# Patient Record
Sex: Female | Born: 1965 | Race: Black or African American | Hispanic: No | Marital: Single | Smoking: Former smoker
Health system: Southern US, Community
[De-identification: ages and names within clinical notes are randomized; demographics above are authoritative.]

## PROBLEM LIST (undated history)

## (undated) DIAGNOSIS — M549 Dorsalgia, unspecified: Secondary | ICD-10-CM

## (undated) HISTORY — PX: COLONOSCOPY: SHX174

## (undated) HISTORY — PX: TUBAL LIGATION: SHX77

---

## 2010-07-03 ENCOUNTER — Ambulatory Visit
Admission: RE | Admit: 2010-07-03 | Discharge: 2010-07-03 | Payer: Self-pay | Source: Home / Self Care | Attending: Obstetrics & Gynecology | Admitting: Obstetrics & Gynecology

## 2010-07-03 ENCOUNTER — Encounter: Payer: Self-pay | Admitting: Obstetrics & Gynecology

## 2010-07-03 LAB — CONVERTED CEMR LAB
Cholesterol: 179 mg/dL (ref 0–200)
HDL: 51 mg/dL (ref >39)
Hepatitis B Surface Ag: NEGATIVE
MCV: 95.8 fL (ref 78.0–100.0)
Platelets: 316 10*3/uL (ref 150–400)
RBC: 4.08 M/uL (ref 3.87–5.11)
Total CHOL/HDL Ratio: 3.5
Triglycerides: 69 mg/dL (ref <150)
VLDL: 14 mg/dL (ref 0–40)
WBC: 6.1 10*3/uL (ref 4.0–10.5)

## 2010-07-08 ENCOUNTER — Ambulatory Visit (HOSPITAL_COMMUNITY)
Admission: RE | Admit: 2010-07-08 | Discharge: 2010-07-08 | Payer: Self-pay | Source: Home / Self Care | Attending: Obstetrics & Gynecology | Admitting: Obstetrics & Gynecology

## 2010-07-08 ENCOUNTER — Ambulatory Visit (HOSPITAL_COMMUNITY)
Admission: RE | Admit: 2010-07-08 | Discharge: 2010-07-08 | Payer: Self-pay | Source: Home / Self Care | Attending: Family Medicine | Admitting: Family Medicine

## 2010-07-08 ENCOUNTER — Ambulatory Visit (HOSPITAL_COMMUNITY)
Admission: RE | Admit: 2010-07-08 | Discharge: 2010-07-08 | Payer: Self-pay | Source: Home / Self Care | Admitting: Family Medicine

## 2011-06-02 ENCOUNTER — Other Ambulatory Visit: Payer: Self-pay | Admitting: Family Medicine

## 2011-06-27 ENCOUNTER — Emergency Department (INDEPENDENT_AMBULATORY_CARE_PROVIDER_SITE_OTHER): Payer: Self-pay

## 2011-06-27 ENCOUNTER — Emergency Department (HOSPITAL_BASED_OUTPATIENT_CLINIC_OR_DEPARTMENT_OTHER)
Admission: EM | Admit: 2011-06-27 | Discharge: 2011-06-27 | Disposition: A | Discharge status: Home / Self Care | Payer: Self-pay | Attending: Emergency Medicine | Admitting: Emergency Medicine

## 2011-06-27 ENCOUNTER — Encounter: Payer: Self-pay | Admitting: *Deleted

## 2011-06-27 ENCOUNTER — Other Ambulatory Visit: Payer: Self-pay

## 2011-06-27 DIAGNOSIS — J029 Acute pharyngitis, unspecified: Secondary | ICD-10-CM

## 2011-06-27 DIAGNOSIS — R51 Headache: Secondary | ICD-10-CM

## 2011-06-27 DIAGNOSIS — R05 Cough: Secondary | ICD-10-CM

## 2011-06-27 DIAGNOSIS — Z79899 Other long term (current) drug therapy: Secondary | ICD-10-CM | POA: Insufficient documentation

## 2011-06-27 DIAGNOSIS — J399 Disease of upper respiratory tract, unspecified: Secondary | ICD-10-CM

## 2011-06-27 DIAGNOSIS — J3489 Other specified disorders of nose and nasal sinuses: Secondary | ICD-10-CM

## 2011-06-27 DIAGNOSIS — J398 Other specified diseases of upper respiratory tract: Secondary | ICD-10-CM | POA: Insufficient documentation

## 2011-06-27 DIAGNOSIS — R059 Cough, unspecified: Secondary | ICD-10-CM | POA: Insufficient documentation

## 2011-06-27 LAB — BASIC METABOLIC PANEL
CO2: 26 mEq/L (ref 19–32)
Chloride: 103 mEq/L (ref 96–112)
Creatinine, Ser: 0.7 mg/dL (ref 0.50–1.10)
Glucose, Bld: 102 mg/dL — ABNORMAL HIGH (ref 70–99)

## 2011-06-27 LAB — CBC
HCT: 36.7 % (ref 36.0–46.0)
Hemoglobin: 12 g/dL (ref 12.0–15.0)
MCV: 94.3 fL (ref 78.0–100.0)
RBC: 3.89 MIL/uL (ref 3.87–5.11)
RDW: 12.9 % (ref 11.5–15.5)
WBC: 6.2 10*3/uL (ref 4.0–10.5)

## 2011-06-27 LAB — DIFFERENTIAL
Basophils Absolute: 0 10*3/uL (ref 0.0–0.1)
Eosinophils Relative: 1 % (ref 0–5)
Lymphocytes Relative: 7 % — ABNORMAL LOW (ref 12–46)
Lymphs Abs: 0.5 10*3/uL — ABNORMAL LOW (ref 0.7–4.0)
Monocytes Absolute: 0.9 10*3/uL (ref 0.1–1.0)
Neutro Abs: 4.8 10*3/uL (ref 1.7–7.7)

## 2011-06-27 LAB — CARDIAC PANEL(CRET KIN+CKTOT+MB+TROPI)
CK, MB: 1.5 ng/mL (ref 0.3–4.0)
Relative Index: INVALID (ref 0.0–2.5)
Total CK: 80 U/L (ref 7–177)

## 2011-06-27 MED ORDER — FLUTICASONE PROPIONATE 50 MCG/ACT NA SUSP: 2.0000 | Freq: Every day | NASAL | Status: AC

## 2011-06-27 MED ORDER — IBUPROFEN 600 MG PO TABS: 600.0000 mg | ORAL_TABLET | Freq: Four times a day (QID) | ORAL | Status: AC | PRN

## 2011-06-27 MED ORDER — DESLORATADINE 5 MG PO TABS: 5.0000 mg | ORAL_TABLET | Freq: Every day | ORAL | Status: AC

## 2011-06-27 NOTE — ED Notes (Signed)
Pt reports nasal congestion productive cough sore throat HA and fever states that she had the same sx 3 weeks ago and that it has came back

## 2011-06-27 NOTE — ED Provider Notes (Addendum)
History     CSN: [id number]  Arrival date & time 06/27/11  7829   First MD Initiated Contact with Patient 06/27/11 [phone]      Chief Complaint  Patient presents with  . Cough    (Consider location/radiation/quality/duration/timing/severity/associated sxs/prior treatment) Patient is a 45 y.o. female presenting with cough. The history is provided by the patient. No language interpreter was used.  Cough This is a recurrent problem. The current episode started more than 2 days ago. The problem occurs every few hours. The problem has not changed since onset.The cough is productive of sputum. The maximum temperature recorded prior to her arrival was 100 to 100.9 F. The fever has been present for 3 to 4 days. Associated symptoms include ear pain, headaches, rhinorrhea and sore throat. Pertinent negatives include no chills, no sweats, no weight loss, no ear congestion, no myalgias, no shortness of breath and no wheezing. Associated symptoms comments: Chest congestion. The treatment provided no relief.  No DOE no SOB no n/v/d.  No swelling of the lower extremities no long car trips or plane trips.  No OCPs PERC negative  History reviewed. No pertinent past medical history.  Past Surgical History  Procedure Date  . Cesarean section   . Tubal ligation     History reviewed. No pertinent family history.  History  Substance Use Topics  . Smoking status: Never Smoker   . Smokeless tobacco: Not on file  . Alcohol Use: No    OB History    Grav Para Term Preterm Abortions TAB SAB Ect Mult Living   4 2   1  1          Review of Systems  Constitutional: Negative for chills and weight loss.  HENT: Positive for ear pain, sore throat and rhinorrhea.   Eyes: Negative for discharge.  Respiratory: Positive for cough. Negative for shortness of breath and wheezing.   Cardiovascular: Negative for palpitations and leg swelling.  Gastrointestinal: Negative.  Negative for abdominal pain.    Genitourinary: Negative for difficulty urinating.  Musculoskeletal: Negative for myalgias and arthralgias.  Neurological: Positive for headaches.  Psychiatric/Behavioral: Negative.     Allergies  Citric acid-sodium citrate  Home Medications   Current Outpatient Rx  Name Route Sig Dispense Refill  . OMEGA-3 FATTY ACIDS 1000 MG PO CAPS Oral Take 2 g by mouth daily.      . DESLORATADINE 5 MG PO TABS Oral Take 1 tablet (5 mg total) by mouth daily. 7 tablet 0  . FLUTICASONE PROPIONATE 50 MCG/ACT NA SUSP Nasal Place 2 sprays into the nose daily. 50 g 0  . IBUPROFEN 600 MG PO TABS Oral Take 1 tablet (600 mg total) by mouth every 6 (six) hours as needed for pain. 30 tablet 0    BP 121/77  Pulse 104  Temp(Src) 99.6 F (37.6 C) (Oral)  Resp 18  SpO2 97%  LMP 06/21/2011  Physical Exam  Constitutional: She appears well-developed and well-nourished.  HENT:  Head: Normocephalic and atraumatic.  Mouth/Throat: Oropharynx is clear and moist.  Eyes: EOM are normal. Pupils are equal, round, and reactive to light.  Neck: Normal range of motion. Neck supple.  Cardiovascular: Normal rate and regular rhythm.   Pulmonary/Chest: Effort normal and breath sounds normal. No stridor. She has no wheezes.  Abdominal: Bowel sounds are normal. There is no tenderness.  Musculoskeletal: Normal range of motion. She exhibits no edema and no tenderness.  Lymphadenopathy:    She has no cervical adenopathy.  Neurological:  She is alert.  Skin: Skin is warm and dry.  Psychiatric: Thought content normal.    ED Course  Procedures (including critical care time)  Labs Reviewed  DIFFERENTIAL - Abnormal; Notable for the following:    Lymphocytes Relative 7 (*)    Lymphs Abs 0.5 (*)    Monocytes Relative 15 (*)    All other components within normal limits  BASIC METABOLIC PANEL - Abnormal; Notable for the following:    Glucose, Bld 102 (*)    All other components within normal limits  CBC  CARDIAC  PANEL(CRET KIN+CKTOT+MB+TROPI)   Dg Chest 2 View  06/27/2011  *RADIOLOGY REPORT*  Clinical Data: Nasal congestion, productive cough, sore throat, headache and fever.  CHEST - 2 VIEW  Comparison: None.  Findings: The heart size and pulmonary vascularity are normal. The lungs appear clear and expanded without focal air space disease or consolidation. No blunting of the costophrenic angles.  No pneumothorax.  Degenerative changes in the spine.  IMPRESSION: No evidence of active pulmonary disease.  Original Report Authenticated By: Marlon Pel, M.D.     1. Upper respiratory disease       MDM   Date: 06/27/2011  Rate: 100  Rhythm: normal sinus rhythm  QRS Axis: normal  Intervals: normal  ST/T Wave abnormalities: normal  Conduction Disutrbances:none  Narrative Interpretation:   Old EKG Reviewed: none available      Return for persistent symptoms shortness or breath chest pain with radiation or any concerning symptoms   Tysheena Ginzburg K Vona Whiters-Rasch, MD 06/27/11 0659  Jamiel Goncalves K Adelaido Nicklaus-Rasch, MD 06/27/11 [phone]

## 2011-07-17 ENCOUNTER — Emergency Department (INDEPENDENT_AMBULATORY_CARE_PROVIDER_SITE_OTHER): Payer: No Typology Code available for payment source

## 2011-07-17 ENCOUNTER — Encounter (HOSPITAL_BASED_OUTPATIENT_CLINIC_OR_DEPARTMENT_OTHER): Payer: Self-pay

## 2011-07-17 ENCOUNTER — Emergency Department (HOSPITAL_BASED_OUTPATIENT_CLINIC_OR_DEPARTMENT_OTHER)
Admission: EM | Admit: 2011-07-17 | Discharge: 2011-07-17 | Disposition: A | Discharge status: Home / Self Care | Payer: No Typology Code available for payment source | Attending: Emergency Medicine | Admitting: Emergency Medicine

## 2011-07-17 DIAGNOSIS — M545 Low back pain: Secondary | ICD-10-CM

## 2011-07-17 DIAGNOSIS — S139XXA Sprain of joints and ligaments of unspecified parts of neck, initial encounter: Secondary | ICD-10-CM | POA: Insufficient documentation

## 2011-07-17 DIAGNOSIS — S161XXA Strain of muscle, fascia and tendon at neck level, initial encounter: Secondary | ICD-10-CM

## 2011-07-17 DIAGNOSIS — M542 Cervicalgia: Secondary | ICD-10-CM

## 2011-07-17 DIAGNOSIS — IMO0002 Reserved for concepts with insufficient information to code with codable children: Secondary | ICD-10-CM | POA: Insufficient documentation

## 2011-07-17 DIAGNOSIS — S39012A Strain of muscle, fascia and tendon of lower back, initial encounter: Secondary | ICD-10-CM

## 2011-07-17 DIAGNOSIS — Y92410 Unspecified street and highway as the place of occurrence of the external cause: Secondary | ICD-10-CM | POA: Insufficient documentation

## 2011-07-17 MED ORDER — OXYCODONE-ACETAMINOPHEN 5-325 MG PO TABS
2.0000 | ORAL_TABLET | Freq: Once | ORAL | Status: AC
  Administered 2011-07-17: 2 via ORAL
  Filled 2011-07-17: qty 2

## 2011-07-17 MED ORDER — HYDROCODONE-ACETAMINOPHEN 5-500 MG PO TABS: 1.0000 | ORAL_TABLET | Freq: Four times a day (QID) | ORAL | Status: AC | PRN

## 2011-07-17 MED ORDER — METAXALONE 800 MG PO TABS: 800.0000 mg | ORAL_TABLET | Freq: Three times a day (TID) | ORAL | Status: AC
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
## 2011-07-17 NOTE — ED Notes (Signed)
The patient had to be brought back in a wheel chair. She had to be helped out of the chair and onto the bed.
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
## 2012-02-15 ENCOUNTER — Emergency Department (HOSPITAL_BASED_OUTPATIENT_CLINIC_OR_DEPARTMENT_OTHER)
Admission: EM | Admit: 2012-02-15 | Discharge: 2012-02-15 | Disposition: A | Discharge status: Home / Self Care | Payer: No Typology Code available for payment source | Attending: Emergency Medicine | Admitting: Emergency Medicine

## 2012-02-15 ENCOUNTER — Encounter (HOSPITAL_BASED_OUTPATIENT_CLINIC_OR_DEPARTMENT_OTHER): Payer: Self-pay | Admitting: Emergency Medicine

## 2012-02-15 DIAGNOSIS — M549 Dorsalgia, unspecified: Secondary | ICD-10-CM | POA: Insufficient documentation

## 2012-02-15 DIAGNOSIS — M5430 Sciatica, unspecified side: Secondary | ICD-10-CM

## 2012-02-15 HISTORY — DX: Dorsalgia, unspecified: M54.9

## 2012-02-15 MED ORDER — HYDROCODONE-ACETAMINOPHEN 5-325 MG PO TABS: 1.0000 | ORAL_TABLET | Freq: Four times a day (QID) | ORAL | Status: AC | PRN

## 2012-02-15 MED ORDER — HYDROCODONE-ACETAMINOPHEN 5-325 MG PO TABS
1.0000 | ORAL_TABLET | Freq: Once | ORAL | Status: AC
  Administered 2012-02-15: 1 via ORAL
  Filled 2012-02-15: qty 1
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
## 2012-02-15 NOTE — ED Provider Notes (Signed)
History     CSN: [id number]  Arrival date & time 02/15/12  0358   First MD Initiated Contact with Patient 02/15/12 [phone]      Chief Complaint  Patient presents with  . Back Pain    (Consider location/radiation/quality/duration/timing/severity/associated sxs/prior treatment) HPI Is a 46 year old black female with a history of chronic back pain. She's had exacerbation of her back pain about 3 weeks ago. The pain is located in the bilateral paralumbar regions and radiates to the buttocks and the backs of the thighs. There is no weakness or numbness associated with it although she does have chronic tingling of the hands and feet for which she states are unrelated reasons. She is on naproxen but this is not providing adequate relief. She denies bowel or bladder changes. She denies acute injury. The pain is moderate to severe at times, worse with movement.  Past Medical History  Diagnosis Date  . Back pain     Past Surgical History  Procedure Date  . Cesarean section   . Tubal ligation     No family history on file.  History  Substance Use Topics  . Smoking status: Never Smoker   . Smokeless tobacco: Not on file  . Alcohol Use: Yes    OB History    Grav Para Term Preterm Abortions TAB SAB Ect Mult Living   4 2   1  1          Review of Systems  All other systems reviewed and are negative.    Allergies  Sod citrate-citric acid  Home Medications   Current Outpatient Rx  Name Route Sig Dispense Refill  . CYCLOBENZAPRINE HCL 10 MG PO TABS Oral Take 10 mg by mouth as needed.    Marland Kitchen NAPROXEN 500 MG PO TABS Oral Take 500 mg by mouth 2 (two) times daily with a meal.    . DESLORATADINE 5 MG PO TABS Oral Take 1 tablet (5 mg total) by mouth daily. 7 tablet 0  . OMEGA-3 FATTY ACIDS 1000 MG PO CAPS Oral Take 2 g by mouth daily.      Marland Kitchen FLUTICASONE PROPIONATE 50 MCG/ACT NA SUSP Nasal Place 2 sprays into the nose daily. 50 g 0    BP 123/88  Pulse 75  Temp 97.6 F (36.4 C)  (Oral)  Resp 18  SpO2 99%  Physical Exam General: Well-developed, well-nourished female in no acute distress; appearance consistent with age of record HENT: normocephalic, atraumatic Eyes: pupils equal round and reactive to light; extraocular muscles intact Neck: supple Heart: regular rate and rhythm Lungs: clear to auscultation bilaterally Abdomen: soft; nondistended Back: Paralumbar tenderness, left greater than right Extremities: No deformity; full range of motion; pulses normal Neurologic: Awake, alert and oriented; motor function intact in all extremities and symmetric; no facial droop Skin: Warm and dry Psychiatric: Normal mood and affect    ED Course  Procedures (including critical care time)    MDM          Hanley Seamen, MD 02/15/12 [phone]

## 2012-02-15 NOTE — ED Notes (Signed)
Pt c/o lower back pain and bilateral hip pain x 3 weeks.

## 2012-02-18 ENCOUNTER — Other Ambulatory Visit: Payer: Self-pay | Admitting: Obstetrics & Gynecology

## 2012-04-03 ENCOUNTER — Other Ambulatory Visit (HOSPITAL_COMMUNITY): Payer: Self-pay | Admitting: Unknown Physician Specialty

## 2012-04-04 ENCOUNTER — Other Ambulatory Visit (HOSPITAL_COMMUNITY): Payer: Self-pay | Admitting: Unknown Physician Specialty

## 2012-04-04 DIAGNOSIS — Z1231 Encounter for screening mammogram for malignant neoplasm of breast: Secondary | ICD-10-CM

## 2012-04-25 ENCOUNTER — Ambulatory Visit (HOSPITAL_COMMUNITY)
Admission: RE | Admit: 2012-04-25 | Discharge: 2012-04-25 | Disposition: A | Discharge status: Home / Self Care | Payer: Self-pay | Source: Ambulatory Visit | Attending: Unknown Physician Specialty | Admitting: Unknown Physician Specialty

## 2012-04-25 DIAGNOSIS — Z1231 Encounter for screening mammogram for malignant neoplasm of breast: Secondary | ICD-10-CM
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
## 2012-10-24 ENCOUNTER — Emergency Department (HOSPITAL_BASED_OUTPATIENT_CLINIC_OR_DEPARTMENT_OTHER)
Admission: EM | Admit: 2012-10-24 | Discharge: 2012-10-24 | Disposition: A | Discharge status: Home / Self Care | Payer: Self-pay | Attending: Emergency Medicine | Admitting: Emergency Medicine

## 2012-10-24 ENCOUNTER — Encounter (HOSPITAL_BASED_OUTPATIENT_CLINIC_OR_DEPARTMENT_OTHER): Payer: Self-pay | Admitting: *Deleted

## 2012-10-24 DIAGNOSIS — Y92410 Unspecified street and highway as the place of occurrence of the external cause: Secondary | ICD-10-CM | POA: Insufficient documentation

## 2012-10-24 DIAGNOSIS — S161XXA Strain of muscle, fascia and tendon at neck level, initial encounter: Secondary | ICD-10-CM

## 2012-10-24 DIAGNOSIS — M545 Low back pain, unspecified: Secondary | ICD-10-CM | POA: Insufficient documentation

## 2012-10-24 DIAGNOSIS — S139XXA Sprain of joints and ligaments of unspecified parts of neck, initial encounter: Secondary | ICD-10-CM | POA: Insufficient documentation

## 2012-10-24 DIAGNOSIS — Z79899 Other long term (current) drug therapy: Secondary | ICD-10-CM | POA: Insufficient documentation

## 2012-10-24 DIAGNOSIS — IMO0002 Reserved for concepts with insufficient information to code with codable children: Secondary | ICD-10-CM | POA: Insufficient documentation

## 2012-10-24 DIAGNOSIS — R209 Unspecified disturbances of skin sensation: Secondary | ICD-10-CM | POA: Insufficient documentation

## 2012-10-24 DIAGNOSIS — G8929 Other chronic pain: Secondary | ICD-10-CM | POA: Insufficient documentation

## 2012-10-24 DIAGNOSIS — Y939 Activity, unspecified: Secondary | ICD-10-CM | POA: Insufficient documentation

## 2012-10-24 MED ORDER — HYDROCODONE-ACETAMINOPHEN 5-325 MG PO TABS: 2.0000 | ORAL_TABLET | ORAL | Status: AC | PRN

## 2012-10-24 MED ORDER — CYCLOBENZAPRINE HCL 10 MG PO TABS: 10.0000 mg | ORAL_TABLET | Freq: Two times a day (BID) | ORAL | Status: DC | PRN
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
## 2012-10-24 NOTE — ED Notes (Signed)
Shoulder and neck and upper back pain reports throbbing shooting pains in her hands states that has been going on 2 weeks

## 2013-06-12 ENCOUNTER — Other Ambulatory Visit (HOSPITAL_COMMUNITY): Payer: Self-pay | Admitting: Unknown Physician Specialty

## 2013-06-12 DIAGNOSIS — Z1231 Encounter for screening mammogram for malignant neoplasm of breast: Secondary | ICD-10-CM

## 2013-07-03 ENCOUNTER — Ambulatory Visit (HOSPITAL_COMMUNITY)
Admission: RE | Admit: 2013-07-03 | Discharge: 2013-07-03 | Disposition: A | Discharge status: Home / Self Care | Payer: BC Managed Care – PPO | Source: Ambulatory Visit | Attending: Unknown Physician Specialty | Admitting: Unknown Physician Specialty

## 2013-07-03 DIAGNOSIS — Z1231 Encounter for screening mammogram for malignant neoplasm of breast: Secondary | ICD-10-CM | POA: Insufficient documentation

## 2014-04-29 ENCOUNTER — Encounter (HOSPITAL_BASED_OUTPATIENT_CLINIC_OR_DEPARTMENT_OTHER): Payer: Self-pay | Admitting: *Deleted

## 2019-03-04 ENCOUNTER — Emergency Department (HOSPITAL_BASED_OUTPATIENT_CLINIC_OR_DEPARTMENT_OTHER)
Admission: EM | Admit: 2019-03-04 | Discharge: 2019-03-04 | Disposition: A | Discharge status: Home / Self Care | Payer: BC Managed Care – PPO | Attending: Emergency Medicine | Admitting: Emergency Medicine

## 2019-03-04 ENCOUNTER — Encounter (HOSPITAL_BASED_OUTPATIENT_CLINIC_OR_DEPARTMENT_OTHER): Payer: Self-pay | Admitting: Emergency Medicine

## 2019-03-04 ENCOUNTER — Other Ambulatory Visit: Payer: Self-pay

## 2019-03-04 DIAGNOSIS — M5431 Sciatica, right side: Secondary | ICD-10-CM | POA: Diagnosis not present

## 2019-03-04 DIAGNOSIS — M5432 Sciatica, left side: Secondary | ICD-10-CM | POA: Diagnosis not present

## 2019-03-04 DIAGNOSIS — M545 Low back pain: Secondary | ICD-10-CM | POA: Diagnosis present

## 2019-03-04 MED ORDER — CYCLOBENZAPRINE HCL 10 MG PO TABS: 10.0000 mg | ORAL_TABLET | Freq: Two times a day (BID) | ORAL | 0 refills | Status: DC | PRN

## 2019-03-04 MED ORDER — NAPROXEN 500 MG PO TABS: 500.0000 mg | ORAL_TABLET | Freq: Two times a day (BID) | ORAL | 0 refills | Status: AC | PRN

## 2019-03-04 MED ORDER — KETOROLAC TROMETHAMINE 60 MG/2ML IM SOLN
30.0000 mg | Freq: Once | INTRAMUSCULAR | Status: AC
  Administered 2019-03-04: 10:00:00 30 mg via INTRAMUSCULAR
  Filled 2019-03-04: qty 2

## 2019-03-04 MED ORDER — PREDNISONE 10 MG (21) PO TBPK: ORAL_TABLET | Freq: Every day | ORAL | 0 refills | Status: DC

## 2019-03-04 NOTE — Discharge Instructions (Signed)
Recommend taking the naproxen as prescribed, steroids as prescribed as well as Tylenol for pain control.  May additionally use Flexeril as a muscle relaxant.  Please note that this medication can make you somewhat drowsy and should not be taken while driving or operating heavy machinery.  Recommend following up with your neurosurgeon as well as your primary care doctor.  Would recommend pursuing physical therapy as well.  If you develop any numbness, weakness, bladder or bowel incontinence, urinary retention, please return to ER for reassessment.

## 2019-03-04 NOTE — ED Provider Notes (Signed)
[facility]ERG[facility] DEPARTMENT Provider Note   CSN: [id number] Arrival date & time: 03/04/19  0906     History   Chief Complaint Chief Complaint  Patient presents with  . Back Pain  . Leg Pain    HPI Lindsey Harris is a 53 y.o. female.  Presents emergency room chief complaint low back pain, radiating bilaterally.  Patient states she for many years she has had episodes of low back pain, has been evaluated by neurosurgery, has received prior dural steroid injections, has gone through physical therapy.  States most recent episode started approximately 1 week ago, similar to prior, lower back, radiates down both right and left leg.  No associated numbness, tingling, bladder or bowel incontinence, saddle anesthesia, urinary retention.  Denies prior history IV drug use.  Has been periodically taking Tylenol, naproxen with intermittent relief.  No difficulty ambulating.     HPI  Past Medical History:  Diagnosis Date  . Back pain     There are no active problems to display for this patient.   Past Surgical History:  Procedure Laterality Date  . CESAREAN SECTION    . TUBAL LIGATION       OB History    Gravida  4   Para  2   Term      Preterm      AB  1   Living        SAB  1   TAB      Ectopic      Multiple      Live Births               Home Medications    Prior to Admission medications   Medication Sig Start Date End Date Taking? Authorizing Provider  cyclobenzaprine (FLEXERIL) 10 MG tablet Take 10 mg by mouth as needed.    [provider]  cyclobenzaprine (FLEXERIL) 10 MG tablet Take 1 tablet (10 mg total) by mouth 2 (two) times daily as needed for muscle spasms. 03/04/19   Lucrezia Starch, MD  desloratadine (CLARINEX) 5 MG tablet Take 1 tablet (5 mg total) by mouth daily. 06/27/11 06/26/12  Palumbo, April, MD  fish oil-omega-3 fatty acids 1000 MG capsule Take 2 g by mouth daily.      [provider]  fluticasone  (FLONASE) 50 MCG/ACT nasal spray Place 2 sprays into the nose daily. 06/27/11 06/26/12  Palumbo, April, MD  HYDROcodone-acetaminophen (NORCO/VICODIN) 5-325 MG per tablet Take 2 tablets by mouth every 4 (four) hours as needed for pain. 10/24/12   Malvin Johns, MD  naproxen (NAPROSYN) 500 MG tablet Take 500 mg by mouth 2 (two) times daily with a meal.    [provider]  naproxen (NAPROSYN) 500 MG tablet Take 1 tablet (500 mg total) by mouth 2 (two) times daily as needed. 03/04/19   Lucrezia Starch, MD  predniSONE (STERAPRED UNI-PAK 21 TAB) 10 MG (21) TBPK tablet Take by mouth daily. Take 6 tabs by mouth daily  for 1 day, then 5 tabs for 1 days, then 4 tabs for 1 days, then 3 tabs for 1 days, 2 tabs for 1 days, then 1 tab by mouth daily for 1 days 03/04/19   Lucrezia Starch, MD    Family History No family history on file.  Social History Social History   Tobacco Use  . Smoking status: Former Research scientist (life sciences)  . Smokeless tobacco: Never Used  Substance Use Topics  . Alcohol use: Yes  Comment: occ  . Drug use: No     Allergies   Sod citrate-citric acid   Review of Systems Review of Systems  Constitutional: Negative for chills and fever.  HENT: Negative for ear pain and sore throat.   Eyes: Negative for pain and visual disturbance.  Respiratory: Negative for cough and shortness of breath.   Cardiovascular: Negative for chest pain and palpitations.  Gastrointestinal: Negative for abdominal pain and vomiting.  Genitourinary: Negative for dysuria and hematuria.  Musculoskeletal: Positive for back pain. Negative for arthralgias.  Skin: Negative for color change and rash.  Neurological: Negative for seizures and syncope.  All other systems reviewed and are negative.    Physical Exam Updated Vital Signs BP 118/80 (BP Location: Right Arm)   Pulse 74   Temp 98.4 F (36.9 C) (Oral)   Resp 18   Ht 5' 4.5" (1.638 m)   Wt 82.4 kg   LMP 07/03/2013   SpO2 100%   BMI 30.71 kg/m    Physical Exam Vitals signs and nursing note reviewed.  Constitutional:      General: She is not in acute distress.    Appearance: She is well-developed.  HENT:     Head: Normocephalic and atraumatic.  Eyes:     Conjunctiva/sclera: Conjunctivae normal.  Neck:     Musculoskeletal: Normal range of motion and neck supple.  Cardiovascular:     Rate and Rhythm: Normal rate and regular rhythm.     Heart sounds: No murmur.  Pulmonary:     Effort: Pulmonary effort is normal. No respiratory distress.     Breath sounds: Normal breath sounds.  Abdominal:     Palpations: Abdomen is soft.     Tenderness: There is no abdominal tenderness.  Musculoskeletal:     Comments: Tenderness palpation over lateral left and lateral right lumbar region  Skin:    General: Skin is warm and dry.     Capillary Refill: Capillary refill takes less than 2 seconds.  Neurological:     General: No focal deficit present.     Mental Status: She is alert and oriented to person, place, and time.     Comments: 5 out of 5 strength in bilateral lower extremities, sensation to light touch intact throughout lower extremities      ED Treatments / Results  Labs (all labs ordered are listed, but only abnormal results are displayed) Labs Reviewed - No data to display  EKG None  Radiology No results found.  Procedures Procedures (including critical care time)  Medications Ordered in ED Medications  ketorolac (TORADOL) injection 30 mg (30 mg Intramuscular Given 03/04/19 0952)     Initial Impression / Assessment and Plan / ED Course  I have reviewed the triage vital signs and the nursing notes.  Pertinent labs & imaging results that were available during my care of the patient were reviewed by me and considered in my medical decision making (see chart for details).        53 year old lady presented to the emergency department with acute on chronic low back pain radiating bilaterally.  On exam she is  well-appearing without any significant distress from pain, ambulating without difficulty.  No red flag symptoms, normal neurologic exam.  Had been evaluated previously by spine surgery, had previously gone through physical therapy.  I recommended that she follow-up with her primary doctor, spine surgeon, as well as physical therapy.  Will prescribe steroid taper, NSAIDs, muscle relaxant.  Reviewed return precautions, will discharge home.  After the discussed management above, the patient was determined to be safe for discharge.  The patient was in agreement with this plan and all questions regarding their care were answered.  ED return precautions were discussed and the patient will return to the ED with any significant worsening of condition.    Final Clinical Impressions(s) / ED Diagnoses   Final diagnoses:  Bilateral sciatica    ED Discharge Orders         Ordered    cyclobenzaprine (FLEXERIL) 10 MG tablet  2 times daily PRN     03/04/19 1010    predniSONE (STERAPRED UNI-PAK 21 TAB) 10 MG (21) TBPK tablet  Daily     03/04/19 1010    naproxen (NAPROSYN) 500 MG tablet  2 times daily PRN     03/04/19 1010           Milagros Loll, MD 03/04/19 1147

## 2019-03-04 NOTE — ED Triage Notes (Signed)
Pt c/o lower back pain with radiation to BLE, chronic for years, but worse over the last few weeks

## 2020-03-30 ENCOUNTER — Encounter (HOSPITAL_BASED_OUTPATIENT_CLINIC_OR_DEPARTMENT_OTHER): Payer: Self-pay | Admitting: Emergency Medicine

## 2020-03-30 ENCOUNTER — Other Ambulatory Visit: Payer: Self-pay

## 2020-03-30 ENCOUNTER — Emergency Department (HOSPITAL_BASED_OUTPATIENT_CLINIC_OR_DEPARTMENT_OTHER)
Admission: EM | Admit: 2020-03-30 | Discharge: 2020-03-30 | Disposition: A | Discharge status: Home / Self Care | Payer: BC Managed Care – PPO | Attending: Emergency Medicine | Admitting: Emergency Medicine

## 2020-03-30 DIAGNOSIS — Z87891 Personal history of nicotine dependence: Secondary | ICD-10-CM | POA: Insufficient documentation

## 2020-03-30 DIAGNOSIS — M545 Low back pain, unspecified: Secondary | ICD-10-CM | POA: Insufficient documentation

## 2020-03-30 DIAGNOSIS — M5136 Other intervertebral disc degeneration, lumbar region: Secondary | ICD-10-CM

## 2020-03-30 DIAGNOSIS — M5431 Sciatica, right side: Secondary | ICD-10-CM

## 2020-03-30 MED ORDER — METHOCARBAMOL 750 MG PO TABS: 750.0000 mg | ORAL_TABLET | Freq: Three times a day (TID) | ORAL | 0 refills | Status: AC | PRN

## 2020-03-30 MED ORDER — IBUPROFEN 400 MG PO TABS
400.0000 mg | ORAL_TABLET | Freq: Once | ORAL | Status: AC
  Administered 2020-03-30: 08:00:00 400 mg via ORAL
  Filled 2020-03-30: qty 1

## 2020-03-30 MED ORDER — PREDNISONE 20 MG PO TABS: ORAL_TABLET | ORAL | 0 refills | Status: DC

## 2020-03-30 NOTE — ED Triage Notes (Signed)
Pt c/o lower back and bilateral hip pain with radiation to legs x 3 days. Reports hx of same

## 2020-03-30 NOTE — ED Provider Notes (Signed)
[facility] EMERGENCY DEPARTMENT Provider Note   CSN: [id number] Arrival date & time: 03/30/20  9233     History Chief Complaint  Patient presents with  . Back Pain    Lindsey Harris is a 54 y.o. female.  Patient with hx ddd, c/o low back pain 'for years', but worse in past couple weeks. Pain gradual onset, moderate, dull, occasionally radiating to bilateral buttocks and posterior legs. Worse w certain movements, bending, lifting. Same as prior back pain, 'sciatica'. Denies recent injury or strain. No numbness or weakness. No loss of normal functional ability. Denies urinary retention, or incontinence. No stool incontinence. No perineal/saddle area numbness. States otherwise does not feel sick or ill.   The history is provided by the patient.  Back Pain Associated symptoms: no abdominal pain, no chest pain, no fever, no numbness and no weakness        Past Medical History:  Diagnosis Date  . Back pain     There are no problems to display for this patient.   Past Surgical History:  Procedure Laterality Date  . CESAREAN SECTION    . TUBAL LIGATION       OB History    Gravida  4   Para  2   Term      Preterm      AB  1   Living        SAB  1   TAB      Ectopic      Multiple      Live Births              History reviewed. No pertinent family history.  Social History   Tobacco Use  . Smoking status: Former Games developer  . Smokeless tobacco: Never Used  Substance Use Topics  . Alcohol use: Yes    Comment: occ  . Drug use: No    Home Medications Prior to Admission medications   Medication Sig Start Date End Date Taking? Authorizing Provider  gabapentin (NEURONTIN) 100 MG capsule Take by mouth. 09/22/16  Yes [provider]  cyclobenzaprine (FLEXERIL) 10 MG tablet Take 10 mg by mouth as needed.    [provider]  cyclobenzaprine (FLEXERIL) 10 MG tablet Take 1 tablet (10 mg total) by mouth 2 (two) times daily as needed  for muscle spasms. 03/04/19   Milagros Loll, MD  desloratadine (CLARINEX) 5 MG tablet Take 1 tablet (5 mg total) by mouth daily. 06/27/11 06/26/12  Palumbo, April, MD  fish oil-omega-3 fatty acids 1000 MG capsule Take 2 g by mouth daily.      [provider]  fluticasone (FLONASE) 50 MCG/ACT nasal spray Place 2 sprays into the nose daily. 06/27/11 06/26/12  Palumbo, April, MD  HYDROcodone-acetaminophen (NORCO/VICODIN) 5-325 MG per tablet Take 2 tablets by mouth every 4 (four) hours as needed for pain. 10/24/12   Rolan Bucco, MD  naproxen (NAPROSYN) 500 MG tablet Take 500 mg by mouth 2 (two) times daily with a meal.    [provider]  naproxen (NAPROSYN) 500 MG tablet Take 1 tablet (500 mg total) by mouth 2 (two) times daily as needed. 03/04/19   Milagros Loll, MD  predniSONE (STERAPRED UNI-PAK 21 TAB) 10 MG (21) TBPK tablet Take by mouth daily. Take 6 tabs by mouth daily  for 1 day, then 5 tabs for 1 days, then 4 tabs for 1 days, then 3 tabs for 1 days, 2 tabs for 1 days, then 1 tab  by mouth daily for 1 days 03/04/19   Milagros Loll, MD    Allergies    Sod citrate-citric acid  Review of Systems   Review of Systems  Constitutional: Negative for chills and fever.  HENT: Negative for sore throat.   Eyes: Negative for redness.  Respiratory: Negative for cough and shortness of breath.   Cardiovascular: Negative for chest pain and leg swelling.  Gastrointestinal: Negative for abdominal pain and vomiting.  Genitourinary: Negative for difficulty urinating and flank pain.  Musculoskeletal: Positive for back pain. Negative for neck pain.  Skin: Negative for rash.  Neurological: Negative for weakness and numbness.  Hematological: Does not bruise/bleed easily.  Psychiatric/Behavioral: Negative for confusion.    Physical Exam Updated Vital Signs BP 112/87 (BP Location: Right Arm)   Pulse 88   Temp 98.7 F (37.1 C)   Resp 18   Ht 1.626 m (5\' 4" )   Wt 81.6 kg    LMP 07/03/2013   SpO2 100%   BMI 30.90 kg/m   Physical Exam Vitals and nursing note reviewed.  Constitutional:      Appearance: Normal appearance. She is well-developed.  HENT:     Head: Atraumatic.     Nose: Nose normal.     Mouth/Throat:     Mouth: Mucous membranes are moist.  Eyes:     General: No scleral icterus.    Conjunctiva/sclera: Conjunctivae normal.  Neck:     Trachea: No tracheal deviation.  Cardiovascular:     Rate and Rhythm: Normal rate.     Pulses: Normal pulses.  Pulmonary:     Effort: Pulmonary effort is normal. No respiratory distress.  Abdominal:     General: There is no distension.     Palpations: Abdomen is soft.     Tenderness: There is no abdominal tenderness.  Genitourinary:    Comments: No cva tenderness.  Musculoskeletal:        General: No swelling.     Cervical back: Normal range of motion and neck supple. No muscular tenderness.     Comments: T/L/S spine non tender, aligned, no step off. Mild lumbar muscular tenderness bilaterally. Good rom bil hips and knees without pain. Distal pulses palp bil. No leg swelling.  Skin:    General: Skin is warm and dry.     Findings: No rash.  Neurological:     Mental Status: She is alert.     Comments: Alert, speech normal. Motor intact bil lower ext, stre 5/5. Sens intact bil. Reflexes 2+, symmetric. Steady gait.   Psychiatric:        Mood and Affect: Mood normal.     ED Results / Procedures / Treatments   Labs (all labs ordered are listed, but only abnormal results are displayed) Labs Reviewed - No data to display  EKG None  Radiology No results found.  Procedures Procedures (including critical care time)  Medications Ordered in ED Medications  ibuprofen (ADVIL) tablet 400 mg (has no administration in time range)    ED Course  I have reviewed the triage vital signs and the nursing notes.  Pertinent labs & imaging results that were available during my care of the patient were reviewed  by me and considered in my medical decision making (see chart for details).    MDM Rules/Calculators/A&P                          Ibuprofen po.  Reviewed nursing notes and prior  charts for additional history. Prior visits/imaging related to back pain reviewed.   Pt denies recent steroid use. rx prednisone and robaxin for home.   Rec heat therapy, back injury prevention measures, and pcp f/u.  Return precautions provided.    Final Clinical Impression(s) / ED Diagnoses Final diagnoses:  None    Rx / DC Orders ED Discharge Orders    None       Cathren Laine, MD 03/30/20 [phone]

## 2020-03-30 NOTE — Discharge Instructions (Addendum)
It was our pleasure to provide your ER care today - we hope that you feel better.  Take prednisone as prescribed. Take robaxin as need for muscle pain/spasm - no driving when taking.   Follow up with your doctor in 1-2 weeks if symptoms fail to improve/resolve.  Return to ER if worse, new symptoms, intractable pain, numbness/weakness, problems with bowel or bladder function, fevers, or other concern.

## 2020-03-30 NOTE — ED Notes (Signed)
Pt ambulatory to room without difficulty 

## 2021-09-02 ENCOUNTER — Other Ambulatory Visit (HOSPITAL_BASED_OUTPATIENT_CLINIC_OR_DEPARTMENT_OTHER): Payer: Self-pay

## 2021-09-02 ENCOUNTER — Encounter (HOSPITAL_BASED_OUTPATIENT_CLINIC_OR_DEPARTMENT_OTHER): Payer: Self-pay | Admitting: Emergency Medicine

## 2021-09-02 ENCOUNTER — Other Ambulatory Visit: Payer: Self-pay

## 2021-09-02 ENCOUNTER — Emergency Department (HOSPITAL_BASED_OUTPATIENT_CLINIC_OR_DEPARTMENT_OTHER)
Admission: EM | Admit: 2021-09-02 | Discharge: 2021-09-02 | Disposition: A | Discharge status: Home / Self Care | Payer: BC Managed Care – PPO | Attending: Emergency Medicine | Admitting: Emergency Medicine

## 2021-09-02 DIAGNOSIS — M25572 Pain in left ankle and joints of left foot: Secondary | ICD-10-CM | POA: Diagnosis not present

## 2021-09-02 DIAGNOSIS — M25472 Effusion, left ankle: Secondary | ICD-10-CM

## 2021-09-02 DIAGNOSIS — M7989 Other specified soft tissue disorders: Secondary | ICD-10-CM | POA: Insufficient documentation

## 2021-09-02 MED ORDER — METHYLPREDNISOLONE 4 MG PO TBPK
ORAL_TABLET | ORAL | 0 refills | Status: AC
  Filled 2021-09-02: qty 21, 6d supply, fill #0

## 2021-09-02 NOTE — ED Triage Notes (Signed)
Reports she injured her left ankle a year ago.  Reports she's working more and its getting worse again.  Having swelling and increased pain.   ?

## 2021-09-02 NOTE — ED Provider Notes (Signed)
?[facility] EMERGENCY DEPARTMENT ?Provider Note ? ? ?CSN: [id number] ?Arrival date & time: 09/02/21  0807 ? ?  ? ?History ? ?Chief Complaint  ?Patient presents with  ? Ankle Pain  ? ? ?Lindsey Harris is a 56 y.o. female presenting to the ED with left ankle pain and swelling.  She experienced a workplace injury she reports approximately 1 year ago, where she caught her foot between a pallet at work.  She twisted her ankle at that time.  She says she went to the [employer]. physician through her company, had x-rays which showed no fracture, subsequently has been treated conservatively for persistent ankle sprain and swelling.  She has received localized injections in the past.  She takes over-the-counter ibuprofen as needed.  She reports that the ankle pain and swelling is gotten worse the past several days.  She is not able to avoid being on her feet at work.  She has been using compression sleeves at home.  She has difficulty bearing full weight on her ankle.  She is not diabetic. ? ?HPI ? ?  ? ?Home Medications ?Prior to Admission medications   ?Medication Sig Start Date End Date Taking? Authorizing Provider  ?methylPREDNISolone (MEDROL DOSEPAK) 4 MG TBPK tablet Use as directed on package 09/02/21  Yes Nyles Mitton, Kermit Balo, MD  ?desloratadine (CLARINEX) 5 MG tablet Take 1 tablet (5 mg total) by mouth daily. 06/27/11 06/26/12  Palumbo, April, MD  ?fish oil-omega-3 fatty acids 1000 MG capsule Take 2 g by mouth daily.      [provider]  ?fluticasone (FLONASE) 50 MCG/ACT nasal spray Place 2 sprays into the nose daily. 06/27/11 06/26/12  Palumbo, April, MD  ?gabapentin (NEURONTIN) 100 MG capsule Take by mouth. 09/22/16   [provider]  ?HYDROcodone-acetaminophen (NORCO/VICODIN) 5-325 MG per tablet Take 2 tablets by mouth every 4 (four) hours as needed for pain. 10/24/12   Rolan Bucco, MD  ?methocarbamol (ROBAXIN) 750 MG tablet Take 1 tablet (750 mg total) by mouth 3 (three) times daily as  needed (muscle spasm/pain). 03/30/20   Cathren Laine, MD  ?naproxen (NAPROSYN) 500 MG tablet Take 500 mg by mouth 2 (two) times daily with a meal.    [provider]  ?naproxen (NAPROSYN) 500 MG tablet Take 1 tablet (500 mg total) by mouth 2 (two) times daily as needed. 03/04/19   Milagros Loll, MD  ?predniSONE (DELTASONE) 20 MG tablet 3 po once a day for 2 days, then 2 po once a day for 3 days, then 1 po once a day for 3 days 03/30/20   Cathren Laine, MD  ?   ? ?Allergies    ?Sod citrate-citric acid   ? ?Review of Systems   ?Review of Systems ? ?Physical Exam ?Updated Vital Signs ?BP (!) 144/106   Pulse 99   Resp 18   Ht 5' 4.5" (1.638 m)   Wt 91.2 kg   LMP 07/03/2013   SpO2 100%   BMI 33.97 kg/m?  ?Physical Exam ?Constitutional:   ?   General: She is not in acute distress. ?HENT:  ?   Head: Normocephalic and atraumatic.  ?Cardiovascular:  ?   Rate and Rhythm: Normal rate and regular rhythm.  ?Pulmonary:  ?   Effort: Pulmonary effort is normal. No respiratory distress.  ?Musculoskeletal:  ?   Comments: Patient does have a palpable left ankle joint effusion, which is not warm, she has full range of motion at the ankle  ?Skin: ?  General: Skin is warm and dry.  ?Neurological:  ?   General: No focal deficit present.  ?   Mental Status: She is alert. Mental status is at baseline.  ? ? ?ED Results / Procedures / Treatments   ?Labs ?(all labs ordered are listed, but only abnormal results are displayed) ?Labs Reviewed - No data to display ? ?EKG ?None ? ?Radiology ?No results found. ? ?Procedures ?Procedures  ? ? ?Medications Ordered in ED ?Medications - No data to display ? ?ED Course/ Medical Decision Making/ A&P ?  ?                        ?Medical Decision Making ?Risk ?Prescription drug management. ? ? ?Patient is here with recurring left ankle joint effusion, likely chronic from her old injury.  Have a low suspicion for fracture, no acute mechanism of trauma, she reports she had negative films done  last year.  I doubt septic joint.  I doubt DVT.  Doubt tendon rupture.  I can refer her to a sports medicine physician, as she may be a candidate for localized injections.  In the meantime I recommended ibuprofen, can prescribe a Medrol Dosepak to use at home, recommended elevation, compression, ice.  We can provide her a work note for the next 2 days to give her some relief off of her feet.  Beyond that she needs to follow-up with a specialist.  She verbalized understanding and agreement with the plan. ? ? ? ? ? ? ? ?Final Clinical Impression(s) / ED Diagnoses ?Final diagnoses:  ?Left ankle swelling  ? ? ?Rx / DC Orders ?ED Discharge Orders   ? ?      Ordered  ?  methylPREDNISolone (MEDROL DOSEPAK) 4 MG TBPK tablet       ? 09/02/21 0850  ? ?  ?  ? ?  ? ? ?  ?Terald Sleeper, MD ?09/02/21 [phone] ? ?

## 2021-09-02 NOTE — Discharge Instructions (Addendum)
?  Keep your ankle elevated at home, ice it for 10 minutes at a time (no longer), and use compression bandages for the next 3-7 days. ? ?

## 2021-09-07 ENCOUNTER — Ambulatory Visit (INDEPENDENT_AMBULATORY_CARE_PROVIDER_SITE_OTHER): Payer: Self-pay | Admitting: Family Medicine

## 2021-09-07 VITALS — BP 130/70 | Ht 64.5 in | Wt 200.0 lb

## 2021-09-07 DIAGNOSIS — M25372 Other instability, left ankle: Secondary | ICD-10-CM | POA: Insufficient documentation

## 2021-09-07 DIAGNOSIS — M25872 Other specified joint disorders, left ankle and foot: Secondary | ICD-10-CM

## 2021-09-07 NOTE — Patient Instructions (Signed)
Nice to meet you ?Please consider compression  ?Please ask about custom orthotics  ?Please send me a message in MyChart with any questions or updates.  ?Please see me back as needed.  ? ?--Dr. Jordan Likes ? ?

## 2021-09-07 NOTE — Progress Notes (Signed)
?  Lindsey Harris - 56 y.o. female MRN [id number]  Date of birth: 1965-12-03 ? ?SUBJECTIVE:  Including CC & ROS.  ?No chief complaint on file. ? ? ?Lindsey Harris is a 56 y.o. female that is presenting with acute on chronic left ankle pain.  Initially her pain started over a year ago in December during work.  She has been followed by a previous provider for this pain.  She continues to have the same pain that is ongoing.  It is worse with prolonged standing and working.  Does get swelling intermittently and using medications and ice as needed. ? ?Review of the emergency department note from 3/8 shows she was provided a Medrol Dosepak. ? ? ?Review of Systems ?See HPI  ? ?HISTORY: Past Medical, Surgical, Social, and Family History Reviewed & Updated per EMR.   ?Pertinent Historical Findings include: ? ?Past Medical History:  ?Diagnosis Date  ? Back pain   ? ? ?Past Surgical History:  ?Procedure Laterality Date  ? CESAREAN SECTION    ? TUBAL LIGATION    ? ? ? ?PHYSICAL EXAM:  ?VS: BP 130/70   Ht 5' 4.5" (1.638 m)   Wt 200 lb (90.7 kg)   LMP 07/03/2013   BMI 33.80 kg/m?  ?Physical Exam ?Gen: NAD, alert, cooperative with exam, well-appearing ?MSK:  ?Neurovascularly intact   ? ? ? ? ?ASSESSMENT & PLAN:  ? 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  Reports the pain is stemming from a previous injury at work.  Likely has impingement given the area of pain and her cavus foot. ?-Counseled on home exercise therapy and supportive care. ?-Counseled on compression. ?-Green sport insoles. ?-Advised to follow-up with the provider handling her Worker's Comp. case.  Could consider custom orthotics. ? ? ? ? ?

## 2021-09-07 NOTE — Assessment & Plan Note (Signed)
Acute on chronic in nature.  Reports the pain is stemming from a previous injury at work.  Likely has impingement given the area of pain and her cavus foot. ?-Counseled on home exercise therapy and supportive care. ?-Counseled on compression. ?-Green sport insoles. ?-Advised to follow-up with the provider handling her Worker's Comp. case.  Could consider custom orthotics. ?

## 2021-09-16 ENCOUNTER — Ambulatory Visit: Payer: BC Managed Care – PPO | Admitting: Family Medicine

## 2021-10-06 ENCOUNTER — Ambulatory Visit: Payer: BC Managed Care – PPO | Admitting: Family Medicine

## 2021-10-20 ENCOUNTER — Other Ambulatory Visit: Payer: Self-pay | Admitting: Orthopedic Surgery

## 2021-10-20 DIAGNOSIS — M5416 Radiculopathy, lumbar region: Secondary | ICD-10-CM

## 2021-11-04 ENCOUNTER — Ambulatory Visit
Admission: RE | Admit: 2021-11-04 | Discharge: 2021-11-04 | Disposition: A | Discharge status: Home / Self Care | Payer: BC Managed Care – PPO | Source: Ambulatory Visit | Attending: Orthopedic Surgery | Admitting: Orthopedic Surgery

## 2021-11-04 DIAGNOSIS — M5416 Radiculopathy, lumbar region: Secondary | ICD-10-CM

## 2022-02-25 ENCOUNTER — Other Ambulatory Visit (HOSPITAL_COMMUNITY): Payer: Self-pay

## 2022-03-23 ENCOUNTER — Ambulatory Visit: Payer: BC Managed Care – PPO | Admitting: Family Medicine

## 2022-08-16 ENCOUNTER — Other Ambulatory Visit: Payer: Self-pay

## 2022-08-16 ENCOUNTER — Encounter (HOSPITAL_BASED_OUTPATIENT_CLINIC_OR_DEPARTMENT_OTHER): Payer: Self-pay | Admitting: Emergency Medicine

## 2022-08-16 DIAGNOSIS — R2242 Localized swelling, mass and lump, left lower limb: Secondary | ICD-10-CM | POA: Diagnosis not present

## 2022-08-16 DIAGNOSIS — M25572 Pain in left ankle and joints of left foot: Secondary | ICD-10-CM | POA: Insufficient documentation

## 2022-08-16 NOTE — ED Triage Notes (Signed)
Pt states she has an old ankle injury on the left and it is swelling and throbbing  Pt states now it is affecting her knee

## 2022-08-17 ENCOUNTER — Emergency Department (HOSPITAL_BASED_OUTPATIENT_CLINIC_OR_DEPARTMENT_OTHER)
Admission: EM | Admit: 2022-08-17 | Discharge: 2022-08-17 | Disposition: A | Discharge status: Home / Self Care | Payer: No Typology Code available for payment source | Attending: Emergency Medicine | Admitting: Emergency Medicine

## 2022-08-17 DIAGNOSIS — M25572 Pain in left ankle and joints of left foot: Secondary | ICD-10-CM

## 2022-08-17 MED ORDER — PREDNISONE 10 MG PO TABS: 20.0000 mg | ORAL_TABLET | Freq: Two times a day (BID) | ORAL | 0 refills | Status: AC

## 2022-08-17 MED ORDER — PREDNISONE 20 MG PO TABS
40.0000 mg | ORAL_TABLET | Freq: Once | ORAL | Status: AC
  Administered 2022-08-17: 40 mg via ORAL
  Filled 2022-08-17: qty 2

## 2022-08-17 NOTE — Discharge Instructions (Signed)
Begin taking prednisone as prescribed.  Take ibuprofen 600 mg every 6 hours as needed for pain.  Follow-up with orthopedics.

## 2022-08-17 NOTE — ED Provider Notes (Signed)
[facility] Provider Note   CSN: [id number] Arrival date & time: 08/16/22  2233     History  Chief Complaint  Patient presents with   Ankle Pain    Lindsey Harris is a 57 y.o. female.  Patient is a 57 year old female with history of chronic left ankle pain and swelling since an injury that occurred at work approximately 2 years ago.  She describes getting her ankle caught in a pallet while working at [employer].  She was off of work for an extended period of time, then ended up being terminated.  Patient has had ongoing discomfort in the ankle since.  Pain seems to be flaring up again over the past several days.  She denies any new injury or trauma.  Pain is worse when she ambulates with no alleviating factors.  She did have 1 flareup of this in the past and states when a steroid which seem to help.  The history is provided by the patient.       Home Medications Prior to Admission medications   Medication Sig Start Date End Date Taking? Authorizing Provider  desloratadine (CLARINEX) 5 MG tablet Take 1 tablet (5 mg total) by mouth daily. 06/27/11 06/26/12  Palumbo, April, MD  fish oil-omega-3 fatty acids 1000 MG capsule Take 2 g by mouth daily.      [provider]  fluticasone (FLONASE) 50 MCG/ACT nasal spray Place 2 sprays into the nose daily. 06/27/11 06/26/12  Palumbo, April, MD  gabapentin (NEURONTIN) 100 MG capsule Take by mouth. 09/22/16   [provider]  HYDROcodone-acetaminophen (NORCO/VICODIN) 5-325 MG per tablet Take 2 tablets by mouth every 4 (four) hours as needed for pain. 10/24/12   Malvin Johns, MD  methocarbamol (ROBAXIN) 750 MG tablet Take 1 tablet (750 mg total) by mouth 3 (three) times daily as needed (muscle spasm/pain). 03/30/20   Lajean Saver, MD  methylPREDNISolone (MEDROL DOSEPAK) 4 MG TBPK tablet Use as directed on package 09/02/21   Wyvonnia Dusky, MD  naproxen (NAPROSYN) 500 MG tablet Take 500 mg  by mouth 2 (two) times daily with a meal.    [provider]  naproxen (NAPROSYN) 500 MG tablet Take 1 tablet (500 mg total) by mouth 2 (two) times daily as needed. 03/04/19   Lucrezia Starch, MD  predniSONE (DELTASONE) 20 MG tablet 3 po once a day for 2 days, then 2 po once a day for 3 days, then 1 po once a day for 3 days 03/30/20   Lajean Saver, MD      Allergies    Sod citrate-citric acid    Review of Systems   Review of Systems  All other systems reviewed and are negative.   Physical Exam Updated Vital Signs BP 122/83 (BP Location: Right Arm)   Pulse 72   Temp 98 F (36.7 C) (Oral)   Resp 16   Ht 5' 4.5" (1.638 m)   Wt 99.8 kg   LMP 07/03/2013   SpO2 98%   BMI 37.18 kg/m  Physical Exam Vitals and nursing note reviewed.  Constitutional:      Appearance: Normal appearance.  HENT:     Head: Normocephalic.  Pulmonary:     Effort: Pulmonary effort is normal.  Musculoskeletal:     Comments: The left ankle is grossly normal in appearance and symmetrical with the right.  There is no obvious swelling.  DP pulses are easily palpable and motor and sensation are intact throughout  the entire foot.  The ankle joint has good range of motion and seems stable.  Skin:    General: Skin is warm and dry.  Neurological:     Mental Status: She is alert.     ED Results / Procedures / Treatments   Labs (all labs ordered are listed, but only abnormal results are displayed) Labs Reviewed - No data to display  EKG None  Radiology No results found.  Procedures Procedures    Medications Ordered in ED Medications  predniSONE (DELTASONE) tablet 40 mg (has no administration in time range)    ED Course/ Medical Decision Making/ A&P  Patient presenting with acute on chronic left ankle pain.  The ankle appears grossly normal and I do not feel as though any imaging studies are indicated.  Patient to be referred to orthopedics.  I will prescribe a course of  prednisone.  Final Clinical Impression(s) / ED Diagnoses Final diagnoses:  None    Rx / DC Orders ED Discharge Orders     None         Veryl Speak, MD 08/17/22 [phone]

## 2022-09-02 ENCOUNTER — Ambulatory Visit (INDEPENDENT_AMBULATORY_CARE_PROVIDER_SITE_OTHER): Payer: No Typology Code available for payment source | Admitting: Family Medicine

## 2022-09-02 ENCOUNTER — Encounter: Payer: Self-pay | Admitting: Family Medicine

## 2022-09-02 VITALS — BP 128/80 | Ht 65.0 in | Wt 220.0 lb

## 2022-09-02 DIAGNOSIS — M25372 Other instability, left ankle: Secondary | ICD-10-CM

## 2022-09-02 NOTE — Assessment & Plan Note (Signed)
Acute on chronic in nature.  Pain has been going on for over a year.  She has tried several conservative measures such as physical therapy, medications and compression without relief.  MRI was showing changes of the anterior inferior talofibular ligament. -Counseled on home exercise therapy and supportive care. -Counseled on compression -Referral to orthopedic surgery.

## 2022-09-02 NOTE — Patient Instructions (Signed)
Good to see you Please use ice as needed  You can consider compression  We have made a referral to the surgeon  Please send me a message in [place] with any questions or updates.  Please see me back as needed.   --Dr. Raeford Razor

## 2022-09-02 NOTE — Progress Notes (Signed)
  Lindsey Harris - 57 y.o. female MRN [id number]  Date of birth: July 11, 1965  SUBJECTIVE:  Including CC & ROS.  No chief complaint on file.   Lindsey Harris is a 57 y.o. female that is presenting with acute on chronic left ankle pain and swelling.  The pain has been ongoing for 1-1/2 years.  She does get improvement with prednisone but the pain always returns with any activity.  Denies any history of surgery.  Has tried physical therapy and different forms compression.  It limits her ability to walk and interferes with her daily life.   Review of the emergency department note from 2/20 shows she was provided prednisone. 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 Review of the left ankle x-ray from 2021 shows no acute changes. Review of the MRI of the left foot from 2022 shows mild thickening of the anterior inferior talofibular ligament.  Review of Systems See HPI   HISTORY: Past Medical, Surgical, Social, and Family History Reviewed & Updated per EMR.   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  Pain has been going on for over a year.  She has tried several conservative measures such as physical therapy, medications and compression without relief.  MRI was showing changes of the anterior inferior talofibular ligament. -Counseled on home exercise therapy and supportive care. -Counseled on compression -Referral to orthopedic surgery.

## 2022-10-06 ENCOUNTER — Other Ambulatory Visit: Payer: Self-pay

## 2022-10-06 ENCOUNTER — Encounter (HOSPITAL_BASED_OUTPATIENT_CLINIC_OR_DEPARTMENT_OTHER): Payer: Self-pay | Admitting: Emergency Medicine

## 2022-10-06 ENCOUNTER — Emergency Department (HOSPITAL_BASED_OUTPATIENT_CLINIC_OR_DEPARTMENT_OTHER)
Admission: EM | Admit: 2022-10-06 | Discharge: 2022-10-06 | Disposition: A | Discharge status: Home / Self Care | Payer: No Typology Code available for payment source | Attending: Emergency Medicine | Admitting: Emergency Medicine

## 2022-10-06 DIAGNOSIS — Z20822 Contact with and (suspected) exposure to covid-19: Secondary | ICD-10-CM | POA: Diagnosis not present

## 2022-10-06 DIAGNOSIS — R197 Diarrhea, unspecified: Secondary | ICD-10-CM | POA: Diagnosis not present

## 2022-10-06 DIAGNOSIS — R000 Tachycardia, unspecified: Secondary | ICD-10-CM | POA: Diagnosis not present

## 2022-10-06 DIAGNOSIS — R112 Nausea with vomiting, unspecified: Secondary | ICD-10-CM | POA: Diagnosis not present

## 2022-10-06 LAB — COMPREHENSIVE METABOLIC PANEL
ALT: 18 U/L (ref 0–44)
AST: 28 U/L (ref 15–41)
Albumin: 3.8 g/dL (ref 3.5–5.0)
Alkaline Phosphatase: 71 U/L (ref 38–126)
Anion gap: 7 (ref 5–15)
BUN: 14 mg/dL (ref 6–20)
CO2: 23 mmol/L (ref 22–32)
Calcium: 8.2 mg/dL — ABNORMAL LOW (ref 8.9–10.3)
Chloride: 103 mmol/L (ref 98–111)
Creatinine, Ser: 0.86 mg/dL (ref 0.44–1.00)
GFR, Estimated: >60 mL/min (ref >60)
Glucose, Bld: 134 mg/dL — ABNORMAL HIGH (ref 70–99)
Potassium: 3.5 mmol/L (ref 3.5–5.1)
Sodium: 133 mmol/L — ABNORMAL LOW (ref 135–145)
Total Bilirubin: 0.5 mg/dL (ref 0.3–1.2)
Total Protein: 7.8 g/dL (ref 6.5–8.1)

## 2022-10-06 LAB — CBC
HCT: 40.6 % (ref 36.0–46.0)
Hemoglobin: 13.3 g/dL (ref 12.0–15.0)
MCH: 30.4 pg (ref 26.0–34.0)
MCHC: 32.8 g/dL (ref 30.0–36.0)
MCV: 92.9 fL (ref 80.0–100.0)
Platelets: 260 10*3/uL (ref 150–400)
RBC: 4.37 MIL/uL (ref 3.87–5.11)
RDW: 13.9 % (ref 11.5–15.5)
WBC: 5.4 10*3/uL (ref 4.0–10.5)
nRBC: 0 % (ref 0.0–0.2)

## 2022-10-06 LAB — URINALYSIS, ROUTINE W REFLEX MICROSCOPIC
Bilirubin Urine: NEGATIVE
Glucose, UA: NEGATIVE mg/dL
Ketones, ur: NEGATIVE mg/dL
Leukocytes,Ua: NEGATIVE
Nitrite: NEGATIVE
Protein, ur: NEGATIVE mg/dL
Specific Gravity, Urine: 1.025 (ref 1.005–1.030)
pH: 6 (ref 5.0–8.0)

## 2022-10-06 LAB — RESP PANEL BY RT-PCR (RSV, FLU A&B, COVID)  RVPGX2
Influenza A by PCR: NEGATIVE
Influenza B by PCR: NEGATIVE
Resp Syncytial Virus by PCR: NEGATIVE
SARS Coronavirus 2 by RT PCR: NEGATIVE

## 2022-10-06 LAB — LIPASE, BLOOD: Lipase: 31 U/L (ref 11–51)

## 2022-10-06 LAB — URINALYSIS, MICROSCOPIC (REFLEX)

## 2022-10-06 LAB — LACTIC ACID, PLASMA: Lactic Acid, Venous: 1.1 mmol/L (ref 0.5–1.9)

## 2022-10-06 MED ORDER — SODIUM CHLORIDE 0.9 % IV BOLUS
500.0000 mL | Freq: Once | INTRAVENOUS | Status: AC
  Administered 2022-10-06: 500 mL via INTRAVENOUS

## 2022-10-06 MED ORDER — DICYCLOMINE HCL 10 MG PO CAPS
10.0000 mg | ORAL_CAPSULE | Freq: Once | ORAL | Status: AC
  Administered 2022-10-06: 10 mg via ORAL
  Filled 2022-10-06: qty 1

## 2022-10-06 MED ORDER — ONDANSETRON HCL 4 MG/2ML IJ SOLN
4.0000 mg | Freq: Once | INTRAMUSCULAR | Status: AC | PRN
  Administered 2022-10-06: 4 mg via INTRAVENOUS
  Filled 2022-10-06: qty 2

## 2022-10-06 MED ORDER — ACETAMINOPHEN 500 MG PO TABS
1000.0000 mg | ORAL_TABLET | Freq: Once | ORAL | Status: AC
  Administered 2022-10-06: 1000 mg via ORAL
  Filled 2022-10-06: qty 2

## 2022-10-06 MED ORDER — ONDANSETRON HCL 4 MG PO TABS: 4.0000 mg | ORAL_TABLET | Freq: Four times a day (QID) | ORAL | 0 refills | Status: AC

## 2022-10-06 MED ORDER — DICYCLOMINE HCL 20 MG PO TABS: 20.0000 mg | ORAL_TABLET | Freq: Two times a day (BID) | ORAL | 0 refills | Status: AC

## 2022-10-06 MED ORDER — SODIUM CHLORIDE 0.9 % IV BOLUS
1000.0000 mL | Freq: Once | INTRAVENOUS | Status: AC
  Administered 2022-10-06: 1000 mL via INTRAVENOUS

## 2022-10-06 NOTE — ED Notes (Signed)
Blood cultures obtained prior to any ABX admin.

## 2022-10-06 NOTE — ED Triage Notes (Addendum)
N/V./D x 2 days.  Pt able to keep down ginger-ale but nothing else.  No known fevers.  Pt states there is a GI bug going around her work

## 2022-10-06 NOTE — ED Provider Notes (Addendum)
[facility] [facility] Provider Note   CSN: [id number] Arrival date & time: 10/06/22  1326     History Chief Complaint  Patient presents with   Emesis   Diarrhea    Lindsey Harris is a 57 y.o. female reportedly otherwise healthy presents the emergency room today for evaluation of nausea, vomiting, diarrhea for the past 2 to 3 days.  She reports that she had around 10 episodes of vomiting and diarrhea the first day that has been gradually improving.  She reports that she is having fecal urgency is and is sometimes incontinent of fecal matter.  She denies any fevers at home.  Reports some runny nose and nasal congestion but accounts this to her seasonal allergies.  She denies any cough, chest pain, shortness of breath, melena, hematochezia, dysuria, or hematuria.  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  She has had work contacts that are sick with a similar symptoms and have been quarantined.  She reports that she has had C. difficile before.  No recent antibiotics.  She denies any recent travel or any recent cruises.  Surgical history includes C-sections.  No known drug allergies.  Denies any tobacco, EtOH, also drug use.   Emesis Associated symptoms: diarrhea   Associated symptoms: no abdominal pain, no chills, no cough, no fever, no headaches and no sore throat   Diarrhea Associated symptoms: vomiting   Associated symptoms: no abdominal pain, no chills, no fever and no headaches        Home Medications Prior to Admission medications   Medication Sig Start Date End Date Taking? Authorizing Provider  dicyclomine (BENTYL) 20 MG tablet Take 1 tablet (20 mg total) by mouth 2 (two) times daily. 10/06/22  Yes Achille Rich, PA-C  ondansetron (ZOFRAN) 4 MG tablet Take 1 tablet (4 mg total) by mouth every 6 (six) hours. 10/06/22  Yes Achille Rich, PA-C  desloratadine (CLARINEX) 5 MG tablet Take 1 tablet (5 mg total) by mouth daily.  06/27/11 06/26/12  Palumbo, April, MD  fish oil-omega-3 fatty acids 1000 MG capsule Take 2 g by mouth daily.      [provider]  fluticasone (FLONASE) 50 MCG/ACT nasal spray Place 2 sprays into the nose daily. 06/27/11 06/26/12  Palumbo, April, MD  gabapentin (NEURONTIN) 100 MG capsule Take by mouth. 09/22/16   [provider]  HYDROcodone-acetaminophen (NORCO/VICODIN) 5-325 MG per tablet Take 2 tablets by mouth every 4 (four) hours as needed for pain. 10/24/12   Rolan Bucco, MD  methocarbamol (ROBAXIN) 750 MG tablet Take 1 tablet (750 mg total) by mouth 3 (three) times daily as needed (muscle spasm/pain). 03/30/20   Cathren Laine, MD  methylPREDNISolone (MEDROL DOSEPAK) 4 MG TBPK tablet Use as directed on package 09/02/21   Terald Sleeper, MD  naproxen (NAPROSYN) 500 MG tablet Take 500 mg by mouth 2 (two) times daily with a meal.    [provider]  naproxen (NAPROSYN) 500 MG tablet Take 1 tablet (500 mg total) by mouth 2 (two) times daily as needed. 03/04/19   Milagros Loll, MD  predniSONE (DELTASONE) 10 MG tablet Take 2 tablets (20 mg total) by mouth 2 (two) times daily. 08/17/22   Geoffery Lyons, MD      Allergies    Sod citrate-citric acid    Review of Systems   Review of Systems  Constitutional:  Negative for chills and fever.  HENT:  Positive for congestion and  rhinorrhea. Negative for sore throat.   Respiratory:  Negative for cough and shortness of breath.   Cardiovascular:  Negative for chest pain.  Gastrointestinal:  Positive for diarrhea, nausea and vomiting. Negative for abdominal pain and blood in stool.  Genitourinary:  Negative for dysuria, hematuria, vaginal bleeding and vaginal discharge.  Musculoskeletal:  Negative for joint swelling.  Neurological:  Negative for headaches.    Physical Exam Updated Vital Signs BP 101/61   Pulse 79   Temp 98.9 F (37.2 C)   Resp 18   Ht 5\' 4"  (1.626 m)   Wt 99.8 kg   LMP 07/03/2013   SpO2 99%   BMI  37.76 kg/m  Physical Exam Vitals and nursing note reviewed.  Constitutional:      General: She is not in acute distress.    Appearance: She is not ill-appearing or toxic-appearing.  HENT:     Mouth/Throat:     Mouth: Mucous membranes are moist.  Eyes:     General: No scleral icterus. Cardiovascular:     Rate and Rhythm: Tachycardia present.  Pulmonary:     Effort: Pulmonary effort is normal. No respiratory distress.     Breath sounds: Normal breath sounds.  Abdominal:     General: Bowel sounds are normal.     Palpations: Abdomen is soft.     Tenderness: There is no abdominal tenderness. There is no right CVA tenderness, left CVA tenderness, guarding or rebound.     Comments: No abdominal tenderness to palpation.  Abdomen soft.  Normal active bowel sounds.  No CVA tenderness bilaterally.  Musculoskeletal:     Cervical back: Normal range of motion.  Lymphadenopathy:     Cervical: No cervical adenopathy.  Skin:    General: Skin is warm and dry.  Neurological:     General: No focal deficit present.     Mental Status: She is alert. Mental status is at baseline.     ED Results / Procedures / Treatments   Labs (all labs ordered are listed, but only abnormal results are displayed) Labs Reviewed  COMPREHENSIVE METABOLIC PANEL - Abnormal; Notable for the following components:      Result Value   Sodium 133 (*)    Glucose, Bld 134 (*)    Calcium 8.2 (*)    All other components within normal limits  URINALYSIS, ROUTINE W REFLEX MICROSCOPIC - Abnormal; Notable for the following components:   Hgb urine dipstick SMALL (*)    All other components within normal limits  URINALYSIS, MICROSCOPIC (REFLEX) - Abnormal; Notable for the following components:   Bacteria, UA FEW (*)    All other components within normal limits  RESP PANEL BY RT-PCR (RSV, FLU A&B, COVID)  RVPGX2  CULTURE, BLOOD (ROUTINE X 2)  CULTURE, BLOOD (ROUTINE X 2)  C DIFFICILE QUICK SCREEN W PCR REFLEX     GASTROINTESTINAL PANEL BY PCR, STOOL (REPLACES STOOL CULTURE)  LIPASE, BLOOD  CBC  LACTIC ACID, PLASMA    EKG None  Radiology No results found.  Procedures Procedures   Medications Ordered in ED Medications  ondansetron (ZOFRAN) injection 4 mg (4 mg Intravenous Given 10/06/22 1408)  sodium chloride 0.9 % bolus 1,000 mL ( Intravenous Stopped 10/06/22 1524)  acetaminophen (TYLENOL) tablet 1,000 mg (1,000 mg Oral Given 10/06/22 1622)  dicyclomine (BENTYL) capsule 10 mg (10 mg Oral Given 10/06/22 1800)  sodium chloride 0.9 % bolus 500 mL (0 mLs Intravenous Stopped 10/06/22 1859)    ED Course/ Medical Decision Making/ A&P  Medical Decision Making Amount and/or Complexity of Data Reviewed Labs: ordered.  Risk OTC drugs. Prescription drug management.    57 y.o. female presents to the ER for evaluation of vomiting and diarrhea. Differential diagnosis includes but is not limited to Infectious causes such as: Viral, Bacterial, Parasitic; Toxin. Noninfectious causes such as: GI Bleed, Appendicitis, Mesenteric Ischemia, Diverticulitis, endocrine causes (adrenal, thyroid), Toxicologic exposures, Drug-associated, IBD. Vital signs initially show tachycardia 115 with mildly increased temperature at 100.8 otherwise normal blood pressure, satting well on room air without any increased work of breathing. Physical exam as noted above.   Will order labs and stool PCR study.  Patient does not have any abdominal tenderness palpation.  Soft without any guarding or rebound.  She has similar sick contacts.  Shared decision making with patient about CT imaging.  She declined at this time which I think is reasonable.  I independently reviewed and interpreted the patient's labs.  CMP shows mildly decreased sodium 133, mildly increased glucose at 134.  Mildly decreased calcium 8.2.  Otherwise, no electrolyte or LFT abnormality.  CBC without leukocytosis or anemia.  Lipase is normal.  Lactic acid normal at  1.1.  Urinalysis shows small amount of hemoglobin with few bacteria and there 6-10 red blood cells however no nitrates or leukocytes or white blood cells, doubt any UTI.  X-ray panel is negative for COVID, flu, RSV.  Blood cultures pending.  C. difficile panel pending.  Stool PCR pending.  The patient was given 2 L of normal saline as well as Bentyl and Zofran and Tylenol.  Her temperature improved at 98.9 as well as her tachycardia.  Blood pressure was mildly low however was done on the wrist that the patient cannot tolerate the squeezing in the upper arm.  Her lab work is overall unremarkable.  We have her cultures pending.  She is nonacute appearing.  She is pleasant and alert.  She reports that she is feeling better after the medications.  Advised her to check back on her MyChart for her PCR results.  Encouraged her to drink fluids as this will keep her well-hydrated.  Discussed with her her medications of Bentyl and Zofran.  Discussed primary care follow-up.  I doubt any diverticulitis or colitis given the patient's nausea and abdominal tenderness palpation.  She does have a fever with some diarrhea but multiple coworkers have similar symptoms.  She is not complaining of any abdominal pain.  Lab work overall reassuring.  We discussed the results of the labs/imaging. The plan is to take medications as prescribed, supportive care, stay well-hydrated, follow-up with PCP. We discussed strict return precautions and red flag symptoms. The patient verbalized their understanding and agrees to the plan. The patient is stable and being discharged home in good condition.  I discussed this case with my attending physician who cosigned this note including patient's presenting symptoms, physical exam, and planned diagnostics and interventions. Attending physician stated agreement with plan or made changes to plan which were implemented.   Portions of this report may have been transcribed using voice recognition  software. Every effort was made to ensure accuracy; however, inadvertent computerized transcription errors may be present.   Final Clinical Impression(s) / ED Diagnoses Final diagnoses:  Nausea and vomiting, unspecified vomiting type  Diarrhea, unspecified type    Rx / DC Orders ED Discharge Orders          Ordered    ondansetron (ZOFRAN) 4 MG tablet  Every 6 hours  10/06/22 1800    dicyclomine (BENTYL) 20 MG tablet  2 times daily        10/06/22 1800              Achille RichRansom, Payson Crumby, PA-C 10/06/22 1959    Achille RichRansom, Alanda Colton, PA-C 10/06/22 1959    Vanetta MuldersZackowski, Scott, MD 10/14/22 [id number]

## 2022-10-06 NOTE — Discharge Instructions (Addendum)
You were seen in the ER today for evaluation of your nausea, vomiting, and diarrhea.  You likely have a viral or bacterial illness given your coworkers symptoms.  We have cultured your stool.  There should be results within the next few days.  If you need antibiotics for a positive culture, someone will call you in some.  I would also like for you to follow-up with your MyChart.  I can prescribe you a medication called Bentyl which will help with that churning sensation in your stomach.  I am also prescribed you some nausea medication called Zofran which you take as needed.  Please make sure you are taking your temperature regularly and taking Tylenol or ibuprofen as needed for fevers.  Please make sure you are staying well-hydrated drinking plenty of fluids, mainly water.  I would also try a bland diet such as bananas, rice, applesauce, toast.  I included more information on nausea, vomiting, diarrhea, and food choices to the discharge paperwork.  Please follow-up with your primary care doctor in the next few days for reevaluation.  If you have any concerns, new or worsening symptoms, please return to the nearest emergency department for reevaluation.  Contact a doctor if: You have a fever. Your diarrhea gets worse. You have new symptoms. You vomit every time you eat or drink. You feel light-headed, dizzy, or you have a headache. You have muscle cramps. You have signs of losing too much water in your body, such as: Dark pee, very little pee, or no pee. Cracked lips. Dry mouth. Sunken eyes. Sleepiness. Weakness. You have bloody or black poop or poop that looks like tar. You have very bad pain, cramping, or bloating in your belly (abdomen). Your skin feels cold and clammy. You feel confused. Get help right away if: You have chest pain. Your heart is beating very quickly. You have trouble breathing or you are breathing very quickly. You feel very weak or you faint. These symptoms may be an  emergency. Get help right away. Call 911. Do not wait to see if the symptoms will go away. Do not drive yourself to the hospital.

## 2022-10-07 ENCOUNTER — Telehealth (HOSPITAL_BASED_OUTPATIENT_CLINIC_OR_DEPARTMENT_OTHER): Payer: Self-pay

## 2022-10-07 LAB — GASTROINTESTINAL PANEL BY PCR, STOOL (REPLACES STOOL CULTURE)

## 2022-10-07 LAB — CULTURE, BLOOD (ROUTINE X 2)

## 2022-10-07 LAB — C DIFFICILE QUICK SCREEN W PCR REFLEX
C Diff antigen: NEGATIVE
C Diff interpretation: NOT DETECTED
C Diff toxin: NEGATIVE

## 2022-10-07 NOTE — Telephone Encounter (Signed)
Received call from lab with + norovirus results from GI panel. Notified Dr. Adela Lank, who states no further action is needed.

## 2022-10-08 LAB — CULTURE, BLOOD (ROUTINE X 2): Special Requests: ADEQUATE

## 2022-10-09 LAB — CULTURE, BLOOD (ROUTINE X 2)

## 2022-10-10 LAB — CULTURE, BLOOD (ROUTINE X 2): Culture: NO GROWTH

## 2022-10-11 ENCOUNTER — Encounter: Payer: Self-pay | Admitting: *Deleted

## 2022-10-11 LAB — CULTURE, BLOOD (ROUTINE X 2): Culture: NO GROWTH

## 2022-11-12 ENCOUNTER — Encounter (HOSPITAL_BASED_OUTPATIENT_CLINIC_OR_DEPARTMENT_OTHER): Payer: Self-pay

## 2022-11-12 ENCOUNTER — Emergency Department (HOSPITAL_BASED_OUTPATIENT_CLINIC_OR_DEPARTMENT_OTHER)
Admission: EM | Admit: 2022-11-12 | Discharge: 2022-11-12 | Disposition: A | Discharge status: Home / Self Care | Payer: Self-pay | Attending: Emergency Medicine | Admitting: Emergency Medicine

## 2022-11-12 ENCOUNTER — Emergency Department (HOSPITAL_BASED_OUTPATIENT_CLINIC_OR_DEPARTMENT_OTHER): Payer: Self-pay

## 2022-11-12 ENCOUNTER — Other Ambulatory Visit: Payer: Self-pay

## 2022-11-12 DIAGNOSIS — I959 Hypotension, unspecified: Secondary | ICD-10-CM | POA: Insufficient documentation

## 2022-11-12 DIAGNOSIS — Z20822 Contact with and (suspected) exposure to covid-19: Secondary | ICD-10-CM | POA: Insufficient documentation

## 2022-11-12 DIAGNOSIS — R000 Tachycardia, unspecified: Secondary | ICD-10-CM | POA: Insufficient documentation

## 2022-11-12 DIAGNOSIS — R112 Nausea with vomiting, unspecified: Secondary | ICD-10-CM | POA: Insufficient documentation

## 2022-11-12 DIAGNOSIS — R197 Diarrhea, unspecified: Secondary | ICD-10-CM | POA: Insufficient documentation

## 2022-11-12 DIAGNOSIS — R350 Frequency of micturition: Secondary | ICD-10-CM | POA: Insufficient documentation

## 2022-11-12 DIAGNOSIS — J329 Chronic sinusitis, unspecified: Secondary | ICD-10-CM | POA: Insufficient documentation

## 2022-11-12 LAB — URINALYSIS, W/ REFLEX TO CULTURE (INFECTION SUSPECTED)
Bilirubin Urine: NEGATIVE
Glucose, UA: NEGATIVE mg/dL
Ketones, ur: 15 mg/dL — AB
Leukocytes,Ua: NEGATIVE
Nitrite: NEGATIVE
Protein, ur: NEGATIVE mg/dL
Specific Gravity, Urine: 1.015 (ref 1.005–1.030)
pH: 6.5 (ref 5.0–8.0)

## 2022-11-12 LAB — COMPREHENSIVE METABOLIC PANEL
ALT: 29 U/L (ref 0–44)
AST: 51 U/L — ABNORMAL HIGH (ref 15–41)
Albumin: 3.4 g/dL — ABNORMAL LOW (ref 3.5–5.0)
Alkaline Phosphatase: 74 U/L (ref 38–126)
Anion gap: 8 (ref 5–15)
BUN: 8 mg/dL (ref 6–20)
CO2: 22 mmol/L (ref 22–32)
Calcium: 8 mg/dL — ABNORMAL LOW (ref 8.9–10.3)
Chloride: 103 mmol/L (ref 98–111)
Creatinine, Ser: 0.9 mg/dL (ref 0.44–1.00)
GFR, Estimated: >60 mL/min (ref >60)
Glucose, Bld: 121 mg/dL — ABNORMAL HIGH (ref 70–99)
Potassium: 3.1 mmol/L — ABNORMAL LOW (ref 3.5–5.1)
Sodium: 133 mmol/L — ABNORMAL LOW (ref 135–145)
Total Bilirubin: 0.7 mg/dL (ref 0.3–1.2)
Total Protein: 8.4 g/dL — ABNORMAL HIGH (ref 6.5–8.1)

## 2022-11-12 LAB — CBC WITH DIFFERENTIAL/PLATELET
Abs Immature Granulocytes: 0.07 10*3/uL (ref 0.00–0.07)
Basophils Absolute: 0 10*3/uL (ref 0.0–0.1)
Basophils Relative: 0 %
Eosinophils Absolute: 0 10*3/uL (ref 0.0–0.5)
Eosinophils Relative: 0 %
HCT: 35.6 % — ABNORMAL LOW (ref 36.0–46.0)
Hemoglobin: 11.7 g/dL — ABNORMAL LOW (ref 12.0–15.0)
Immature Granulocytes: 1 %
Lymphocytes Relative: 10 %
Lymphs Abs: 0.8 10*3/uL (ref 0.7–4.0)
MCH: 30.3 pg (ref 26.0–34.0)
MCHC: 32.9 g/dL (ref 30.0–36.0)
MCV: 92.2 fL (ref 80.0–100.0)
Monocytes Absolute: 0.4 10*3/uL (ref 0.1–1.0)
Monocytes Relative: 5 %
Neutro Abs: 6 10*3/uL (ref 1.7–7.7)
Neutrophils Relative %: 84 %
Platelets: 276 10*3/uL (ref 150–400)
RBC: 3.86 MIL/uL — ABNORMAL LOW (ref 3.87–5.11)
RDW: 14.3 % (ref 11.5–15.5)
WBC: 7.3 10*3/uL (ref 4.0–10.5)
nRBC: 0 % (ref 0.0–0.2)

## 2022-11-12 LAB — LACTIC ACID, PLASMA
Lactic Acid, Venous: 1 mmol/L (ref 0.5–1.9)
Lactic Acid, Venous: 0.8 mmol/L (ref 0.5–1.9)

## 2022-11-12 LAB — SARS CORONAVIRUS 2 BY RT PCR: SARS Coronavirus 2 by RT PCR: NEGATIVE

## 2022-11-12 MED ORDER — ACETAMINOPHEN 325 MG PO TABS
650.0000 mg | ORAL_TABLET | Freq: Once | ORAL | Status: AC | PRN
  Administered 2022-11-12: 650 mg via ORAL
  Filled 2022-11-12: qty 2

## 2022-11-12 MED ORDER — SODIUM CHLORIDE 0.9 % IV BOLUS: 1000.0000 mL | Freq: Once | INTRAVENOUS | Status: DC

## 2022-11-12 MED ORDER — SODIUM CHLORIDE 0.9 % IV BOLUS
1000.0000 mL | Freq: Once | INTRAVENOUS | Status: AC
  Administered 2022-11-12: 1000 mL via INTRAVENOUS

## 2022-11-12 MED ORDER — AMOXICILLIN 500 MG PO CAPS: 500.0000 mg | ORAL_CAPSULE | Freq: Three times a day (TID) | ORAL | 0 refills | Status: AC

## 2022-11-12 NOTE — ED Triage Notes (Signed)
Patient has had a body aches and feeling bad for two weeks.

## 2022-11-12 NOTE — ED Provider Notes (Signed)
[place] [facility] Provider Note   CSN: [id number] Arrival date & time: 11/12/22  1743     History  Chief Complaint  Patient presents with   Fever    Lindsey Harris is a 57 y.o. female.   Fever Patient presents with a month of feeling bad.  Reportedly started with coughing nausea vomiting and diarrhea.  Now still having fevers.  States she is having urinary frequency.  That is been going last few days.  Fever upon arrival.  Hypotensive.  Has had mildly decreased oral intake.  No abdominal pain.  No further cough.  No rash.  States the nausea medicine gave her sore throat and she stopped taking it.     Home Medications Prior to Admission medications   Medication Sig Start Date End Date Taking? Authorizing Provider  amoxicillin (AMOXIL) 500 MG capsule Take 1 capsule (500 mg total) by mouth 3 (three) times daily. 11/12/22  Yes Benjiman Core, MD  desloratadine (CLARINEX) 5 MG tablet Take 1 tablet (5 mg total) by mouth daily. 06/27/11 06/26/12  Palumbo, April, MD  dicyclomine (BENTYL) 20 MG tablet Take 1 tablet (20 mg total) by mouth 2 (two) times daily. 10/06/22   Achille Rich, PA-C  fish oil-omega-3 fatty acids 1000 MG capsule Take 2 g by mouth daily.      [provider]  fluticasone (FLONASE) 50 MCG/ACT nasal spray Place 2 sprays into the nose daily. 06/27/11 06/26/12  Palumbo, April, MD  gabapentin (NEURONTIN) 100 MG capsule Take by mouth. 09/22/16   [provider]  HYDROcodone-acetaminophen (NORCO/VICODIN) 5-325 MG per tablet Take 2 tablets by mouth every 4 (four) hours as needed for pain. 10/24/12   Rolan Bucco, MD  methocarbamol (ROBAXIN) 750 MG tablet Take 1 tablet (750 mg total) by mouth 3 (three) times daily as needed (muscle spasm/pain). 03/30/20   Cathren Laine, MD  methylPREDNISolone (MEDROL DOSEPAK) 4 MG TBPK tablet Use as directed on package 09/02/21   Terald Sleeper, MD  naproxen (NAPROSYN) 500 MG tablet Take  500 mg by mouth 2 (two) times daily with a meal.    [provider]  naproxen (NAPROSYN) 500 MG tablet Take 1 tablet (500 mg total) by mouth 2 (two) times daily as needed. 03/04/19   Milagros Loll, MD  ondansetron (ZOFRAN) 4 MG tablet Take 1 tablet (4 mg total) by mouth every 6 (six) hours. 10/06/22   Achille Rich, PA-C  predniSONE (DELTASONE) 10 MG tablet Take 2 tablets (20 mg total) by mouth 2 (two) times daily. 08/17/22   Geoffery Lyons, MD      Allergies    Sod citrate-citric acid    Review of Systems   Review of Systems  Constitutional:  Positive for fever.    Physical Exam Updated Vital Signs BP 118/69   Pulse 92   Temp 98.8 F (37.1 C) (Oral)   Resp 20   Ht 5\' 4"  (1.626 m)   Wt 99.8 kg   LMP 07/03/2013   SpO2 97%   BMI 37.76 kg/m  Physical Exam Vitals reviewed.  HENT:     Head: Normocephalic.     Mouth/Throat:     Pharynx: Posterior oropharyngeal erythema present. No oropharyngeal exudate.  Eyes:     Pupils: Pupils are equal, round, and reactive to light.  Cardiovascular:     Rate and Rhythm: Tachycardia present.  Pulmonary:     Breath sounds: No wheezing or rhonchi.  Abdominal:     Tenderness:  There is no abdominal tenderness.  Musculoskeletal:        General: No tenderness.  Skin:    General: Skin is warm.     ED Results / Procedures / Treatments   Labs (all labs ordered are listed, but only abnormal results are displayed) Labs Reviewed  COMPREHENSIVE METABOLIC PANEL - Abnormal; Notable for the following components:      Result Value   Sodium 133 (*)    Potassium 3.1 (*)    Glucose, Bld 121 (*)    Calcium 8.0 (*)    Total Protein 8.4 (*)    Albumin 3.4 (*)    AST 51 (*)    All other components within normal limits  CBC WITH DIFFERENTIAL/PLATELET - Abnormal; Notable for the following components:   RBC 3.86 (*)    Hemoglobin 11.7 (*)    HCT 35.6 (*)    All other components within normal limits  URINALYSIS, W/ REFLEX TO CULTURE  (INFECTION SUSPECTED) - Abnormal; Notable for the following components:   Hgb urine dipstick SMALL (*)    Ketones, ur 15 (*)    Bacteria, UA RARE (*)    All other components within normal limits  SARS CORONAVIRUS 2 BY RT PCR  CULTURE, BLOOD (ROUTINE X 2)  CULTURE, BLOOD (ROUTINE X 2)  LACTIC ACID, PLASMA  LACTIC ACID, PLASMA    EKG None  Radiology DG Chest 2 View  Result Date: 11/12/2022 CLINICAL DATA:  Cough and congestion, short of breath for 5 days EXAM: CHEST - 2 VIEW COMPARISON:  04/19/2022 FINDINGS: Frontal and lateral views of the chest demonstrate an unremarkable cardiac silhouette. No airspace disease, effusion, or pneumothorax. No acute bony abnormality. IMPRESSION: 1. No acute intrathoracic process. Electronically Signed   By: Sharlet Salina M.D.   On: 11/12/2022 18:24    Procedures Procedures    Medications Ordered in ED Medications  sodium chloride 0.9 % bolus 1,000 mL (has no administration in time range)  acetaminophen (TYLENOL) tablet 650 mg (650 mg Oral Given 11/12/22 1759)  sodium chloride 0.9 % bolus 1,000 mL ( Intravenous Stopped 11/12/22 2223)    ED Course/ Medical Decision Making/ A&P                             Medical Decision Making Amount and/or Complexity of Data Reviewed Labs: ordered. Radiology: ordered.  Risk OTC drugs. Prescription drug management.   Patient with fever.  Mild hypotension with pressure of 99 systolic.  Tachycardia.  Does have some dysuria.  Will get basic blood work.  Well-appearing.  Has had symptoms for around a month now.  Doubt severe sepsis but will get blood work including lactic acid and blood cultures.  Chest x-ray reviewed and shows no pneumonia.  Benign abdominal exam.  Reviewed note from recent ER visit.  Blood work reassuring.  Has had some mild hypotension.  Has had same in the past however.  Doubt severe sepsis.  Chest x-ray reassuring.  Negative COVID testing.  Urine does not show infection.  However patient  has had sinus issues for months now.  With the fever and still sinus congestion will treat with antibiotics but appears stable for discharge home.  Outpatient follow-up as needed.        Final Clinical Impression(s) / ED Diagnoses Final diagnoses:  Sinusitis, unspecified chronicity, unspecified location    Rx / DC Orders ED Discharge Orders  Ordered    amoxicillin (AMOXIL) 500 MG capsule  3 times daily        11/12/22 2308              Benjiman Core, MD 11/12/22 2321

## 2022-11-13 LAB — CULTURE, BLOOD (ROUTINE X 2): Special Requests: ADEQUATE

## 2022-11-15 LAB — CULTURE, BLOOD (ROUTINE X 2)

## 2022-11-16 LAB — CULTURE, BLOOD (ROUTINE X 2): Special Requests: ADEQUATE

## 2022-11-17 LAB — CULTURE, BLOOD (ROUTINE X 2)
Culture: NO GROWTH
Culture: NO GROWTH
